# Patient Record
Sex: Male | Born: 2000 | Race: White | Hispanic: No | Marital: Single | Smoking: Never smoker
Health system: Southern US, Community
[De-identification: ages and names within clinical notes are randomized; demographics above are authoritative.]

## PROBLEM LIST (undated history)

## (undated) DIAGNOSIS — K0510 Chronic gingivitis, plaque induced: Secondary | ICD-10-CM

## (undated) DIAGNOSIS — J309 Allergic rhinitis, unspecified: Secondary | ICD-10-CM

## (undated) DIAGNOSIS — F909 Attention-deficit hyperactivity disorder, unspecified type: Secondary | ICD-10-CM

## (undated) DIAGNOSIS — R55 Syncope and collapse: Secondary | ICD-10-CM

## (undated) DIAGNOSIS — F32A Depression, unspecified: Secondary | ICD-10-CM

## (undated) DIAGNOSIS — Z8701 Personal history of pneumonia (recurrent): Secondary | ICD-10-CM

## (undated) DIAGNOSIS — Z8709 Personal history of other diseases of the respiratory system: Secondary | ICD-10-CM

## (undated) DIAGNOSIS — F329 Major depressive disorder, single episode, unspecified: Secondary | ICD-10-CM

## (undated) HISTORY — PX: OTHER SURGICAL HISTORY: SHX169

## (undated) HISTORY — DX: Syncope and collapse: R55

## (undated) HISTORY — DX: Allergic rhinitis, unspecified: J30.9

## (undated) HISTORY — DX: Chronic gingivitis, plaque induced: K05.10

## (undated) HISTORY — DX: Personal history of pneumonia (recurrent): Z87.01

## (undated) HISTORY — DX: Attention-deficit hyperactivity disorder, unspecified type: F90.9

## (undated) HISTORY — DX: Depression, unspecified: F32.A

## (undated) HISTORY — DX: Personal history of other diseases of the respiratory system: Z87.09

## (undated) HISTORY — PX: NASAL SEPTUM SURGERY: SHX37

---
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
## 2021-12-07 ENCOUNTER — Ambulatory Visit: Payer: 59 | Admitting: Behavioral Health

## 2021-12-11 ENCOUNTER — Ambulatory Visit (INDEPENDENT_AMBULATORY_CARE_PROVIDER_SITE_OTHER): Payer: 59 | Admitting: Psychiatry

## 2021-12-11 DIAGNOSIS — F4322 Adjustment disorder with anxiety: Secondary | ICD-10-CM | POA: Diagnosis not present

## 2021-12-11 NOTE — Progress Notes (Signed)
?    [facility] Counselor/Therapist Progress Note ? ?Patient ID: Thomas Graves, MRN: [id number],   ? ?Date: 12/11/2021 ? ?Time Spent: 50 minutes  ? ?Treatment Type: Individual Therapy ? ?Reported Symptoms: Anxiety, stressed, just completed his first year of college at the [employer] in [place]  ? ?Mental Status Exam: ? ?Appearance:   Casual     ?Behavior:  Appropriate, Sharing, and Motivated  ?Motor:  Normal  ?Speech/Language:   Clear and Coherent  ?Affect:  anxious  ?Mood:  anxious  ?Thought process:  goal directed  ?Thought content:    WNL  ?Sensory/Perceptual disturbances:    WNL  ?Orientation:  oriented to person, place, time/date, situation, day of week, month of year, year, and stated date of Dec 11, 2021  ?Attention:  Good  ?Concentration:  Good  ?Memory:  WNL  ?Fund of knowledge:   Good  ?Insight:    Good  ?Judgment:   Good  ?Impulse Control:  Good  ? ?Risk Assessment: ?Danger to Self:  No ?Self-injurious Behavior: No ?Danger to Others: No ?Duty to Warn:no ?Physical Aggression / Violence:No  ?Access to Firearms a concern: No  ?Gang Involvement:No  ? ?Subjective: Patient in today reporting anxiety after just completing first yr of college in [place].  Did not know anyone when he first went to school there within this past year and encountered some struggles early on but has been adjusting some over this first year.  Maturing some socially and working on some anxiety and adjustment issues, noting some progress as well as challenges he is still working on.  Did work some part-time near the campus but trying to better balance any work hours with his academic load.  His home this summer and involved in some things that his church as well as participating in an educational program in [place] for 1 month this summer as part of his schoolwork.  Patient nervous but also wants to have this opportunity as he feels that will help him personally and educationally.  Going to school out of state that far away  from home is a huge step for this young man and he is showing good motivation, insight, and resilience whenever challenges arise, and tries to stay "leveled out" when things do not always go as planned or present bigger obstacles than he expected.  Feeling some pride within himself after getting through this first year and looking forward to returning to college in mid August. ?  ?Interventions:  ?Solution-Oriented/Positive Psychology and Insight-Oriented ? ?Plan of Care:  ?Patient not signing tx plan on computer screen due to COVID. ?Treatment  Goals: ?Goals remain on plan as patient works on strategies to meet his goals.  Progress is noted every session in "Progress" section of Plan. ?Long term goal: ?Develop healthy interpersonal relationships that lead to the alleviation of anxiety and depression and help prevent relapse of anxious or depressive symtpoms.  ?Short term goal: ?Verbally express understanding of the relationship between anxious/depressed mood and repression of feelings, such as anger, hurt, and sadness. ?Strategies: ?1. Identify cognitive beliefs and self-talk that leads to feeling anxious or depressed. ?2. Assist in developing awareness of cognitive messages that reinforce anxiety or depression.   ? ?Diagnosis: ?  ICD-10-CM   ?1. Adjustment disorder with anxious mood  F43.22   ?  ? ?Plan: Patient in today after not being seen and overall due to being away for his first year at [employer].  Discusses some anxiety issues and adjustment issues which  we processed in session today.  Has matured in some ways showing some progress interpersonally and working and socializing with other people.  Shared several experiences of this during his campus life as well as working a part-time job at school.  Anxious thoughts are not as overpowering and works to interrupt them and replace with more reality-based and empowering thoughts that do not support anxiety.  Difficulty getting adjusted initially at  school but feels he is making some improvement, and it does show in his behavior although still a work in progress.  Self-confidence has improved and feels that he does still need to stay on his ADHD meds and will be seeing the med provider for that next week. Encouraged patient in more positive behaviors including: Staying in the present and focusing on what he can control or change, getting outside daily, having friend contacts daily, staying in touch with people who are supportive, looking for more positives versus negatives every day, healthy nutrition and exercise, practice consistent positive self talk, use of daily affirmations, challenge and counteract any self doubt, allow his faith to be an emotional support as well as spiritual, saying no when he needs to say no to set limits, and to recognize the strength he shows working with goal directed behaviors to move in a direction that supports his improved emotional health and self-confidence. ? ?Goal review and progress/challenges noted with patient. ? ?Next appointment within 2 weeks. ? ?This record has been created using [employer].  Chart creation errors have been sought, but may not always have been located and corrected.  Such creation errors do not reflect on the standard of medical care provided. ? ? ?Mathis Fare, LCSW ? ? ? ? ? ? ? ? ? ? ? ? ? ? ? ? ? ? ?

## 2021-12-16 ENCOUNTER — Ambulatory Visit (INDEPENDENT_AMBULATORY_CARE_PROVIDER_SITE_OTHER): Payer: 59 | Admitting: Behavioral Health

## 2021-12-16 ENCOUNTER — Encounter: Payer: Self-pay | Admitting: Behavioral Health

## 2021-12-16 VITALS — BP 117/66 | HR 55 | Ht 73.0 in | Wt 192.0 lb

## 2021-12-16 DIAGNOSIS — F4322 Adjustment disorder with anxiety: Secondary | ICD-10-CM | POA: Diagnosis not present

## 2021-12-16 DIAGNOSIS — F902 Attention-deficit hyperactivity disorder, combined type: Secondary | ICD-10-CM | POA: Diagnosis not present

## 2021-12-16 MED ORDER — METHYLPHENIDATE HCL ER (OSM) 18 MG PO TBCR: 18.0000 mg | EXTENDED_RELEASE_TABLET | Freq: Every day | ORAL | 0 refills | Status: DC

## 2021-12-16 NOTE — Progress Notes (Deleted)
[facility] MD/PA/NP Initial Note ? ?12/16/2021 10:05 AM ?Thomas Graves  ?MRN:  [id number] ? ?Chief Complaint:  ?Chief Complaint   ?ADHD; Medication Refill; Follow-up; Medication Problem ?  ? ? ?HPI: *** ? ?Visit Diagnosis:  ?  ICD-10-CM   ?1. Adjustment disorder with anxious mood  F43.22   ?  ?2. Attention deficit hyperactivity disorder (ADHD), combined type, mild  F90.2 methylphenidate 18 MG PO CR tablet  ?  ? ? ?Past Psychiatric History:  ? ?Past Medical History:  ?Past Medical History:  ?Diagnosis Date  ? ADHD (attention deficit hyperactivity disorder)   ? Allergic rhinitis   ? Depression   ? Gingivitis due to dental plaque   ? History of pneumonia   ? History of sinusitis   ? Vasovagal syncope   ?  ?Past Surgical History:  ?Procedure Laterality Date  ? arm surgery    ? NASAL SEPTUM SURGERY    ? ? ?Family Psychiatric History: *** ? ?Family History:  ?Family History  ?Problem Relation Age of Onset  ? Anxiety disorder Mother   ? Anxiety disorder Brother   ? Depression Brother   ? Anxiety disorder Maternal Grandfather   ? Anxiety disorder Other   ? ? ?Social History:  ?Social History  ? ?Socioeconomic History  ? Marital status: Single  ?  Spouse name: Not on file  ? Number of children: Not on file  ? Years of education: Not on file  ? Highest education level: 10th grade  ?Occupational History  ? Not on file  ?Tobacco Use  ? Smoking status: Never  ? Smokeless tobacco: Never  ?Vaping Use  ? Vaping Use: Never used  ?Substance and Sexual Activity  ? Alcohol use: No  ? Drug use: Never  ? Sexual activity: Never  ?Other Topics Concern  ? Not on file  ?Social History Narrative  ? 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  He is currently bored  and easily upset as grades are down including from procrastination and not focusing when he should be doing his best making 1300 on the PSAT as he hopes to have an out-of-town college in the future but underachieving in this critical school year.  He is apprehensive of having problems like older brother now in his third college at [employer] after having to leave [facility] by way of [employer] on medication from Corie Chiquito, PMHNP in this office for anxiety and depression.  Father tells the patient to just get it done while mother is attempting to be supportive and understanding as well as expectant and encouraging.  However the patient states he never has utilized reinforcement or reward to guide his behavior, being a deep thinker highly intelligent now finding his education overwhelming.  ? ?Social Determinants of Health  ? ?Financial Resource Strain: Not on file  ?Food Insecurity: Not on file  ?Transportation Needs: Not on file  ?Physical Activity: Not on file  ?Stress: Not on file  ?Social Connections: Not on file  ? ? ?Allergies: No Known Allergies ? ?Metabolic Disorder Labs: ?No results found for: HGBA1C, MPG ?No results found for: PROLACTIN ?No results found for: CHOL, TRIG, HDL, CHOLHDL, VLDL, LDLCALC ?No results found for: TSH ? ?Therapeutic Level Labs: ?  No results found for: LITHIUM ?No results found for: VALPROATE ?No components found for:  CBMZ ? ?Current Medications: ?Current Outpatient Medications  ?Medication Sig Dispense Refill  ? methylphenidate 18 MG PO CR tablet Take 1 tablet (18 mg total) by mouth daily. 30 tablet 0  ? acetaminophen-codeine (TYLENOL #3) 300-30 MG tablet Take 1 tablet by mouth every 6 (six) hours as needed for moderate pain. 10 tablet 0  ? methylphenidate 36 MG PO CR tablet Take 1 tablet (36 mg total) by mouth daily after breakfast. 30 tablet 0  ? methylphenidate 36 MG PO CR tablet Take 1 tablet (36 mg total) by mouth daily after breakfast. 30 tablet 0  ? methylphenidate 36  MG PO CR tablet Take 1 tablet (36 mg total) by mouth daily after breakfast. 30 tablet 0  ? ?No current facility-administered medications for this visit.  ? ? ?Medication Side Effects: {Medication Side Effects (Optional):12147} ? ?Orders placed this visit:  No orders of the defined types were placed in this encounter. ? ? ?Psychiatric Specialty Exam: ? ?Review of Systems  ?There were no vitals taken for this visit.There is no height or weight on file to calculate BMI.  ?General Appearance: {PSY:[id number]}  ?Eye Contact:  {PSY:22684}  ?Speech:  {PSY:[phone]}  ?Volume:  {PSY:22686}  ?Mood:  {PSY:22306}  ?Affect:  {[phone]}  ?Thought Process:  {PSY:22688}  ?Orientation:  {PSY:22689}  ?Thought Content: {PSY:22690}   ?Suicidal Thoughts:  {PSY:22692}  ?Homicidal Thoughts:  {PSY:22692}  ?Memory:  {PSY:[id number]}  ?Judgement:  {PSY:22694}  ?Insight:  {PSY:22695}  ?Psychomotor Activity:  {PSY:22696}  ?Concentration:  {PSY:21399}  ?Recall:  {PSY:22877}  ?Fund of Knowledge: {PSY:22877}  ?Language: {ESL:75300}  ?Assets:  {PSY:22698}  ?ADL's:  {PSY:22290}  ?Cognition: {FRT:[id number]}  ?Prognosis:  {PSY:22877}  ? ?Screenings:  ? ?Receiving Psychotherapy: {PSY:21197} ? ?Treatment Plan/Recommendations: *** ? ? ? ?Joan Flores, NP ? ?         ?

## 2021-12-16 NOTE — Progress Notes (Signed)
[facility] MD/PA/NP Initial Note ? ?12/16/2021 10:16 AM ?Thomas BentonNathan Graves  ?MRN:  [id number] ? ?Chief Complaint:  ?Chief Complaint   ?ADHD; Medication Refill; Follow-up; Medication Problem ?  ? ? ?HPI:  ?"Thomas Graves", 21 year old male presents to this office for initial visit and to establish care. He is former patient of Dr. Beverly MilchGlenn Jennings. He was diagnosed with ADHD about 2.5 years ago and took medication during his junior and senior years in HS. He is now a freshmen in college at [employer]. He says that he has been taking very sparingly and sometimes would not take the med but says the increased demands at school and the workload have substantiated a need to continue Methylphenidate. He says he has been doing well but would like to decrease the dosage this time. He denies anxiety and depression. Is sleeping 7-8 hours every night. No mania, no psychosis, no SI/HI. ? ?No prior medication failures ? ? ? ? ?Visit Diagnosis:  ?  ICD-10-CM   ?1. Adjustment disorder with anxious mood  F43.22   ?  ?2. Attention deficit hyperactivity disorder (ADHD), combined type, mild  F90.2 methylphenidate 18 MG PO CR tablet  ?  ? ? ?Past Psychiatric History:  ? ?Past Medical History:  ?Past Medical History:  ?Diagnosis Date  ? ADHD (attention deficit hyperactivity disorder)   ? Allergic rhinitis   ? Depression   ? Gingivitis due to dental plaque   ? History of pneumonia   ? History of sinusitis   ? Vasovagal syncope   ?  ?Past Surgical History:  ?Procedure Laterality Date  ? arm surgery    ? NASAL SEPTUM SURGERY    ? ? ?Family Psychiatric History: see chart ? ?Family History:  ?Family History  ?Problem Relation Age of Onset  ? Anxiety disorder Mother   ? Anxiety disorder Brother   ? Depression Brother   ? Anxiety disorder Maternal Grandfather   ? Anxiety disorder Other   ? ? ?Social History:  ?Social History  ? ?Socioeconomic History  ? Marital status: Single  ?  Spouse name: Not on file  ? Number of children: Not on file  ? Years  of education: Not on file  ? Highest education level: 10th grade  ?Occupational History  ? Not on file  ?Tobacco Use  ? Smoking status: Never  ? Smokeless tobacco: Never  ?Vaping Use  ? Vaping Use: Never used  ?Substance and Sexual Activity  ? Alcohol use: No  ? Drug use: Never  ? Sexual activity: Never  ?Other Topics Concern  ? Not on file  ?Social History Narrative  ? 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  He is currently bored and easily upset as grades are down including from procrastination and not focusing when he should be doing his best making 1300 on the PSAT as he hopes to have an out-of-town college in the future but underachieving in this critical school year.  He is apprehensive of having problems like older brother now in his third college at [employer] after having to leave [employer] by way of [employer] on medication from Corie ChiquitoJessica Carter, PMHNP in this office for anxiety and depression.  Father tells the patient to  just get it done while mother is attempting to be supportive and understanding as well as expectant and encouraging.  However the patient states he never has utilized reinforcement or reward to guide his behavior, being a deep thinker highly intelligent now finding his education overwhelming.  ? ?Social Determinants of Health  ? ?Financial Resource Strain: Not on file  ?Food Insecurity: Not on file  ?Transportation Needs: Not on file  ?Physical Activity: Not on file  ?Stress: Not on file  ?Social Connections: Not on file  ? ? ?Allergies: No Known Allergies ? ?Metabolic Disorder Labs: ?No results found for: HGBA1C, MPG ?No results found for: PROLACTIN ?No results found for: CHOL, TRIG, HDL, CHOLHDL,  VLDL, LDLCALC ?No results found for: TSH ? ?Therapeutic Level Labs: ?No results found for: LITHIUM ?No results found for: VALPROATE ?No components found for:  CBMZ ? ?Current Medications: ?Current Outpatient Medications  ?Medication Sig Dispense Refill  ? methylphenidate 18 MG PO CR tablet Take 1 tablet (18 mg total) by mouth daily. 30 tablet 0  ? acetaminophen-codeine (TYLENOL #3) 300-30 MG tablet Take 1 tablet by mouth every 6 (six) hours as needed for moderate pain. 10 tablet 0  ? methylphenidate 36 MG PO CR tablet Take 1 tablet (36 mg total) by mouth daily after breakfast. 30 tablet 0  ? methylphenidate 36 MG PO CR tablet Take 1 tablet (36 mg total) by mouth daily after breakfast. 30 tablet 0  ? methylphenidate 36 MG PO CR tablet Take 1 tablet (36 mg total) by mouth daily after breakfast. 30 tablet 0  ? ?No current facility-administered medications for this visit.  ? ? ?Medication Side Effects: none ? ?Orders placed this visit:  No orders of the defined types were placed in this encounter. ? ? ?Psychiatric Specialty Exam: ? ?Review of Systems  ?Constitutional: Negative.   ?Musculoskeletal:  Negative for gait problem.  ?Allergic/Immunologic: Negative.   ?Neurological:  Negative for tremors.  ?Psychiatric/Behavioral:  Positive for decreased concentration.    ?There were no vitals taken for this visit.There is no height or weight on file to calculate BMI.  ?General Appearance: Casual, Neat, and Well Groomed  ?Eye Contact:  Good  ?Speech:  Clear and Coherent  ?Volume:  Normal  ?Mood:  Angry  ?Affect:  Appropriate  ?Thought Process:  Coherent  ?Orientation:  Full (Time, Place, and Person)  ?Thought Content: Logical   ?Suicidal Thoughts:  No  ?Homicidal Thoughts:  No  ?Memory:  WNL  ?Judgement:  Good  ?Insight:  Good  ?Psychomotor Activity:  Normal  ?Concentration:  Concentration: Good  ?Recall:  Good  ?Fund of Knowledge: Good  ?Language: Good  ?Assets:  Desire for Improvement  ?ADL's:  Intact  ?Cognition: WNL   ?Prognosis:  Good  ? ?Screenings:  ?PHQ2-9   ? ?Flowsheet Row Office Visit from 12/16/2021 in [facility]  ?PHQ-2 Total Score 0  ? ?  ? ? ?Receiving Psychotherapy: No  ? ?Treatment Plan/Recommendations:  ? ?Greater than 50% of  45 min face to face time with patient was spent on counseling and coordination of care. We discussed his hx of ADHD and his care under Dr. Beverly Milch retired. He is using medication very sparingly and appropriately. He is requesting continued medication management and refills. ?We agreed to: ?Continue methylphenidate 18 mg daily after breakfast ?To follow up regularly for medication check ?Report side effects or problems  ?Provided emergency contact information ?Discussed potential benefits, risks, and side effects of stimulants with patient to include  increased heart rate, palpitations, insomnia, increased anxiety, increased irritability, or decreased appetite.  Instructed patient to contact office if experiencing any significant tolerability issues.  ?Reviewed PDMP ? ? ?Joan Flores, NP ? ?         ?

## 2021-12-30 ENCOUNTER — Ambulatory Visit: Payer: 59 | Admitting: Psychiatry

## 2022-01-06 ENCOUNTER — Telehealth: Payer: Self-pay | Admitting: Behavioral Health

## 2022-01-07 NOTE — Telephone Encounter (Signed)
Error

## 2022-05-28 ENCOUNTER — Telehealth: Payer: Self-pay | Admitting: Behavioral Health

## 2022-05-28 ENCOUNTER — Ambulatory Visit: Payer: 59 | Admitting: Behavioral Health

## 2022-05-28 ENCOUNTER — Other Ambulatory Visit: Payer: Self-pay

## 2022-05-28 DIAGNOSIS — F902 Attention-deficit hyperactivity disorder, combined type: Secondary | ICD-10-CM

## 2022-05-28 MED ORDER — METHYLPHENIDATE HCL ER (OSM) 18 MG PO TBCR: 18.0000 mg | EXTENDED_RELEASE_TABLET | Freq: Every day | ORAL | 0 refills | Status: DC

## 2022-05-28 NOTE — Telephone Encounter (Signed)
Pended.

## 2022-05-28 NOTE — Telephone Encounter (Signed)
Pt requesting Rx for Brand Concerta 18 mg to [facility] in [address].  Aaron Edelman agreed to sent 30 day Rx due to power outage and had to reschedule apt. Ask Barnett Applebaum to sign off on Rx ASAP. Pt going back to school today.

## 2022-05-28 NOTE — Telephone Encounter (Signed)
Pt's mom LVM @ 3:51p.  She is NOT on DPR.  She said that the Concerta sent to [facility] was denied and needs a PA.  Next appt 11/21

## 2022-05-31 NOTE — Telephone Encounter (Signed)
Pt picked up medication on 05/28/2022 for Brand name using commercial insurance

## 2022-06-22 ENCOUNTER — Ambulatory Visit (INDEPENDENT_AMBULATORY_CARE_PROVIDER_SITE_OTHER): Payer: 59 | Admitting: Behavioral Health

## 2022-06-22 ENCOUNTER — Telehealth: Payer: Self-pay | Admitting: Behavioral Health

## 2022-06-22 ENCOUNTER — Encounter: Payer: Self-pay | Admitting: Behavioral Health

## 2022-06-22 ENCOUNTER — Other Ambulatory Visit: Payer: Self-pay

## 2022-06-22 DIAGNOSIS — F4322 Adjustment disorder with anxiety: Secondary | ICD-10-CM

## 2022-06-22 DIAGNOSIS — F902 Attention-deficit hyperactivity disorder, combined type: Secondary | ICD-10-CM

## 2022-06-22 MED ORDER — METHYLPHENIDATE HCL ER (OSM) 18 MG PO TBCR: 18.0000 mg | EXTENDED_RELEASE_TABLET | Freq: Every day | ORAL | 0 refills | Status: DC

## 2022-06-22 NOTE — Telephone Encounter (Signed)
Patient's mom states that she informed by insurance that [facility] needs a PA for Concerta. Ph: [phone] Pls fax to [phone]

## 2022-06-22 NOTE — Progress Notes (Signed)
[facility] Med Check  Patient ID: Thomas Graves,  MRN: [id number]  PCP: Eartha Inch, MD  Date of Evaluation: 06/22/2022 Time spent:20 minutes  Chief Complaint:  Chief Complaint   ADHD; Follow-up; Patient Education; Medication Refill     HISTORY/CURRENT STATUS: HPI "Thomas Graves", 21 year old male presents to this office for follow up and medication management.  He would like to continue with his Concerta. Medication has been working well. Frustrated with the continue shortages. He denies anxiety and depression. Is sleeping 7-8 hours every night. No mania, no psychosis, no SI/HI.   No prior medication failures   Individual Medical History/ Review of Systems: Changes? :No   Allergies: Patient has no known allergies.  Current Medications:  Current Outpatient Medications:    acetaminophen-codeine (TYLENOL #3) 300-30 MG tablet, Take 1 tablet by mouth every 6 (six) hours as needed for moderate pain., Disp: 10 tablet, Rfl: 0   methylphenidate (CONCERTA) 18 MG PO CR tablet, Take 1 tablet (18 mg total) by mouth daily., Disp: 30 tablet, Rfl: 0   methylphenidate 18 MG PO CR tablet, Take 1 tablet (18 mg total) by mouth daily., Disp: 30 tablet, Rfl: 0   methylphenidate 36 MG PO CR tablet, Take 1 tablet (36 mg total) by mouth daily after breakfast., Disp: 30 tablet, Rfl: 0   methylphenidate 36 MG PO CR tablet, Take 1 tablet (36 mg total) by mouth daily after breakfast., Disp: 30 tablet, Rfl: 0   methylphenidate 36 MG PO CR tablet, Take 1 tablet (36 mg total) by mouth daily after breakfast., Disp: 30 tablet, Rfl: 0 Medication Side Effects: none  Family Medical/ Social History: Changes? No  MENTAL HEALTH EXAM:  There were no vitals taken for this visit.There is no height or weight on file to calculate BMI.  General Appearance: Casual  Eye Contact:  Good  Speech:  Clear and Coherent  Volume:  Normal  Mood:  NA  Affect:  Appropriate  Thought Process:  Coherent  Orientation:  Full  (Time, Place, and Person)  Thought Content: Logical   Suicidal Thoughts:  No  Homicidal Thoughts:  No  Memory:  WNL  Judgement:  Good  Insight:  Good  Psychomotor Activity:  Normal  Concentration:  Concentration: Good  Recall:  Good  Fund of Knowledge: Good  Language: Good  Assets:  Desire for Improvement  ADL's:  Intact  Cognition: WNL  Prognosis:  Good    DIAGNOSES:    ICD-10-CM   1. Attention deficit hyperactivity disorder (ADHD), combined type, mild  F90.2     2. Adjustment disorder with anxious mood  F43.22       Receiving Psychotherapy: No    RECOMMENDATIONS:   Greater than 50% of  20 min face to face time with patient was spent on counseling and coordination of care. He report doing fine with Concerta and his only issue has been obtaining the medication due to national shortage. 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 We agreed to: Continue methylphenidate 18 mg daily after breakfast To follow up regularly for medication check Report side effects or problems  Provided emergency contact information Discussed potential benefits, risks, and side effects of stimulants with patient to include increased heart rate, palpitations, insomnia, increased anxiety, increased irritability, or decreased appetite.  Instructed patient to contact office if experiencing any significant tolerability issues.  Reviewed PDMP     Joan Flores, NP

## 2022-06-22 NOTE — Telephone Encounter (Signed)
Patient was in the office today to see Arlys John. Mom came to my office and we discussed that insurance will not cover Brand Concerta. Patient is due for a refill 11/24 and is currently available at a local pharmacy. Refill pended to [facility]. Mom is aware that generic may need a PA also and that it can take a couple of days. She is also aware that we are closed Friday for TG.

## 2022-10-11 ENCOUNTER — Ambulatory Visit (INDEPENDENT_AMBULATORY_CARE_PROVIDER_SITE_OTHER): Payer: 59 | Admitting: Behavioral Health

## 2022-10-11 ENCOUNTER — Encounter: Payer: Self-pay | Admitting: Behavioral Health

## 2022-10-11 DIAGNOSIS — F902 Attention-deficit hyperactivity disorder, combined type: Secondary | ICD-10-CM | POA: Diagnosis not present

## 2022-10-11 MED ORDER — METHYLPHENIDATE HCL ER (OSM) 18 MG PO TBCR: 18.0000 mg | EXTENDED_RELEASE_TABLET | Freq: Every day | ORAL | 0 refills | Status: DC

## 2022-10-11 NOTE — Progress Notes (Signed)
[facility]  Patient ID: Thomas Graves,  MRN: [id number]  PCP: Chesley Noon, MD  Date of Evaluation: 10/11/2022 Time spent:20 minutes  Chief Complaint:  Chief Complaint   ADHD; Patient Education; Medication Refill; Follow-up     HISTORY/CURRENT STATUS: HPI "Thomas Graves", 22 year old male presents to this office for follow up and medication management.  He would like to continue with his Concerta. Medication has been working well. Frustrated with the continue shortages. He denies anxiety and depression. Is sleeping 7-8 hours every night. No mania, no psychosis, no SI/HI.   No prior medication failures Individual Medical History/ Review of Systems: Changes? :No   Allergies: Patient has no known allergies.  Current Medications:  Current Outpatient Medications:    acetaminophen-codeine (TYLENOL #3) 300-30 MG tablet, Take 1 tablet by mouth every 6 (six) hours as needed for moderate pain., Disp: 10 tablet, Rfl: 0   methylphenidate (CONCERTA) 18 MG PO CR tablet, Take 1 tablet (18 mg total) by mouth daily., Disp: 30 tablet, Rfl: 0   methylphenidate 18 MG PO CR tablet, Take 1 tablet (18 mg total) by mouth daily., Disp: 30 tablet, Rfl: 0   methylphenidate 36 MG PO CR tablet, Take 1 tablet (36 mg total) by mouth daily after breakfast., Disp: 30 tablet, Rfl: 0   methylphenidate 36 MG PO CR tablet, Take 1 tablet (36 mg total) by mouth daily after breakfast., Disp: 30 tablet, Rfl: 0   methylphenidate 36 MG PO CR tablet, Take 1 tablet (36 mg total) by mouth daily after breakfast., Disp: 30 tablet, Rfl: 0 Medication Side Effects: none  Family Medical/ Social History: Changes? No  MENTAL HEALTH EXAM:  There were no vitals taken for this visit.There is no height or weight on file to calculate BMI.  General Appearance: Casual and Neat  Eye Contact:  Good  Speech:  Clear and Coherent  Volume:  Normal  Mood:  NA  Affect:  Appropriate  Thought Process:  Coherent  Orientation:   Full (Time, Place, and Person)  Thought Content: Logical   Suicidal Thoughts:  No  Homicidal Thoughts:  No  Memory:  WNL  Judgement:  Good  Insight:  Good  Psychomotor Activity:  Normal  Concentration:  Concentration: Good  Recall:  Good  Fund of Knowledge: Good  Language: Good  Assets:  Desire for Improvement  ADL's:  Intact  Cognition: WNL  Prognosis:  Good    DIAGNOSES:    ICD-10-CM   1. Attention deficit hyperactivity disorder (ADHD), combined type, mild  F90.2 methylphenidate 18 MG PO CR tablet      Receiving Psychotherapy: No    RECOMMENDATIONS:   Greater than 50% of  20 min face to face time with patient was spent on counseling and coordination of care. He report doing fine with Concerta and his only issue has been obtaining the medication due to national shortage. 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 We agreed to: Continue methylphenidate 18 mg daily after breakfast To follow up regularly for medication check Report side effects or problems  Provided emergency contact information Discussed potential benefits, risks, and side effects of stimulants with patient to include increased heart rate, palpitations, insomnia, increased anxiety, increased irritability, or decreased appetite.  Instructed patient to contact office if experiencing any significant tolerability issues.  Reviewed PDMP      Elwanda Brooklyn, NP

## 2023-12-08 ENCOUNTER — Encounter: Payer: Self-pay | Admitting: Behavioral Health

## 2023-12-08 ENCOUNTER — Ambulatory Visit (INDEPENDENT_AMBULATORY_CARE_PROVIDER_SITE_OTHER): Admitting: Behavioral Health

## 2023-12-08 ENCOUNTER — Telehealth: Payer: Self-pay | Admitting: Behavioral Health

## 2023-12-08 DIAGNOSIS — F902 Attention-deficit hyperactivity disorder, combined type: Secondary | ICD-10-CM

## 2023-12-08 MED ORDER — METHYLPHENIDATE HCL ER (OSM) 27 MG PO TBCR: 27.0000 mg | EXTENDED_RELEASE_TABLET | ORAL | 0 refills | Status: DC

## 2023-12-08 NOTE — Progress Notes (Signed)
 [facility]  Patient ID: Thomas Graves,  MRN: [id number]  PCP: Emaline Handsome, MD  Date of Evaluation: 12/08/2023 Time spent:20 minutes  Chief Complaint:  Chief Complaint   ADHD; Follow-up; Patient Education; Medication Refill     HISTORY/CURRENT STATUS: HPI "Thomas Graves", 23 year old male presents to this office for follow up and medication management.  He would like to continue with his Concerta . Requesting increasing the dose to 27 mg.  He denies anxiety and depression. Is sleeping 7-8 hours every night. No mania, no psychosis, no SI/HI.   No prior medication failures Individual Medical History/ Review of Systems: Changes? :No   Allergies: Patient has no known allergies.  Current Medications:  Current Outpatient Medications:    methylphenidate  (CONCERTA ) 27 MG PO CR tablet, Take 1 tablet (27 mg total) by mouth every morning., Disp: 30 tablet, Rfl: 0   acetaminophen -codeine  (TYLENOL  #3) 300-30 MG tablet, Take 1 tablet by mouth every 6 (six) hours as needed for moderate pain., Disp: 10 tablet, Rfl: 0   methylphenidate  (CONCERTA ) 18 MG PO CR tablet, Take 1 tablet (18 mg total) by mouth daily., Disp: 30 tablet, Rfl: 0   methylphenidate  18 MG PO CR tablet, Take 1 tablet (18 mg total) by mouth daily., Disp: 30 tablet, Rfl: 0   methylphenidate  36 MG PO CR tablet, Take 1 tablet (36 mg total) by mouth daily after breakfast., Disp: 30 tablet, Rfl: 0   methylphenidate  36 MG PO CR tablet, Take 1 tablet (36 mg total) by mouth daily after breakfast., Disp: 30 tablet, Rfl: 0   methylphenidate  36 MG PO CR tablet, Take 1 tablet (36 mg total) by mouth daily after breakfast., Disp: 30 tablet, Rfl: 0 Medication Side Effects: none  Family Medical/ Social History: Changes? No  MENTAL HEALTH EXAM:  There were no vitals taken for this visit.There is no height or weight on file to calculate BMI.  General Appearance: Casual, Neat, and Well Groomed  Eye Contact:  Good  Speech:  Clear and  Coherent  Volume:  Normal  Mood:  NA  Affect:  Appropriate  Thought Process:  Coherent  Orientation:  Full (Time, Place, and Person)  Thought Content: Logical   Suicidal Thoughts:  No  Homicidal Thoughts:  No  Memory:  WNL  Judgement:  Good  Insight:  Good  Psychomotor Activity:  Normal  Concentration:  Concentration: Good  Recall:  Good  Fund of Knowledge: Good  Language: Good  Assets:  Desire for Improvement  ADL's:  Intact  Cognition: WNL  Prognosis:  Good    DIAGNOSES:    ICD-10-CM   1. Attention deficit hyperactivity disorder (ADHD), combined type, mild  F90.2 methylphenidate  (CONCERTA ) 27 MG PO CR tablet      Receiving Psychotherapy: No    RECOMMENDATIONS:   Greater than 50% of  20 min face to face time with patient was spent on counseling and coordination of care. Has been doing ok with Concerta  18 mg but would like to try increasing to 27 mg due to some afternoon decline.  We agreed to: Increase  methylphenidate  to 27 mg daily after breakfast To follow up regularly for medication check Report side effects or problems  Provided emergency contact information Discussed potential benefits, risks, and side effects of stimulants with patient to include increased heart rate, palpitations, insomnia, increased anxiety, increased irritability, or decreased appetite.  Instructed patient to contact office if experiencing any significant tolerability issues.  Reviewed PDMP     Lincoln Renshaw,  NP

## 2023-12-08 NOTE — Telephone Encounter (Signed)
 Mom called to report that the insurance no longer covers prescriptions at [facility].  Please cancel the prescription for Concerta  27mg  at [facility] and send to  [id number] - [place], [place] - [address]   Remover the [facility] from the preferred pharmacies since they can't use it.

## 2023-12-08 NOTE — Telephone Encounter (Signed)
 Canceled Rx at [facility], updated pharmacy profile.  Will repend to [facility].

## 2023-12-09 ENCOUNTER — Other Ambulatory Visit: Payer: Self-pay

## 2023-12-09 DIAGNOSIS — F902 Attention-deficit hyperactivity disorder, combined type: Secondary | ICD-10-CM

## 2023-12-09 MED ORDER — METHYLPHENIDATE HCL ER (OSM) 27 MG PO TBCR: 27.0000 mg | EXTENDED_RELEASE_TABLET | ORAL | 0 refills | Status: DC

## 2024-03-05 ENCOUNTER — Ambulatory Visit: Admitting: Behavioral Health

## 2024-03-06 ENCOUNTER — Ambulatory Visit (INDEPENDENT_AMBULATORY_CARE_PROVIDER_SITE_OTHER): Admitting: Behavioral Health

## 2024-03-06 ENCOUNTER — Encounter: Payer: Self-pay | Admitting: Behavioral Health

## 2024-03-06 DIAGNOSIS — F902 Attention-deficit hyperactivity disorder, combined type: Secondary | ICD-10-CM | POA: Diagnosis not present

## 2024-03-06 MED ORDER — METHYLPHENIDATE HCL ER (OSM) 27 MG PO TBCR: 27.0000 mg | EXTENDED_RELEASE_TABLET | ORAL | 0 refills | Status: AC

## 2024-03-06 MED ORDER — METHYLPHENIDATE HCL ER (OSM) 27 MG PO TBCR: 27.0000 mg | EXTENDED_RELEASE_TABLET | ORAL | 0 refills | Status: DC

## 2024-03-06 NOTE — Progress Notes (Signed)
 [facility]  Patient ID: Thomas Graves,  MRN: [id number]  PCP: Sophronia Ozell BROCKS, MD  Date of Evaluation: 03/06/2024 Time spent:20 minutes  Chief Complaint:  Chief Complaint   ADHD; Follow-up; Patient Education; Medication Refill     HISTORY/CURRENT STATUS: HPI   Thomas Graves, 23 year old male presents to this office for follow up and medication management.  He would like to continue with his Concerta . Currently very happy with dosage. He is returning to [place]  for college this fall.  He denies anxiety and depression. Is sleeping 7-8 hours every night. No mania, no psychosis, no SI/HI.   No prior medication failures  Individual Medical History/ Review of Systems: Changes? :No   Allergies: Patient has no known allergies.  Current Medications:  Current Outpatient Medications:    [START ON 04/05/2024] methylphenidate  (CONCERTA ) 27 MG PO CR tablet, Take 1 tablet (27 mg total) by mouth every morning., Disp: 30 tablet, Rfl: 0   [START ON 05/05/2024] methylphenidate  (CONCERTA ) 27 MG PO CR tablet, Take 1 tablet (27 mg total) by mouth every morning., Disp: 30 tablet, Rfl: 0   acetaminophen -codeine  (TYLENOL  #3) 300-30 MG tablet, Take 1 tablet by mouth every 6 (six) hours as needed for moderate pain., Disp: 10 tablet, Rfl: 0   methylphenidate  (CONCERTA ) 27 MG PO CR tablet, Take 1 tablet (27 mg total) by mouth every morning., Disp: 30 tablet, Rfl: 0 Medication Side Effects: none  Family Medical/ Social History: Changes? No  MENTAL HEALTH EXAM:  There were no vitals taken for this visit.There is no height or weight on file to calculate BMI.  General Appearance: Casual, Neat, and Well Groomed  Eye Contact:  Good  Speech:  Clear and Coherent  Volume:  Normal  Mood:  NA  Affect:  Appropriate  Thought Process:  Coherent  Orientation:  Full (Time, Place, and Person)  Thought Content: Logical   Suicidal Thoughts:  No  Homicidal Thoughts:  No  Memory:  WNL  Judgement:  Good   Insight:  Good  Psychomotor Activity:  Normal  Concentration:  Concentration: Good  Recall:  Good  Fund of Knowledge: Good  Language: Good  Assets:  Desire for Improvement  ADL's:  Intact  Cognition: WNL  Prognosis:  Good    DIAGNOSES:    ICD-10-CM   1. 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 Discussed his good stability with Concerta  27 mg. Requesting no med adjustments this visit.  We agreed to: Increase  methylphenidate  to 27 mg daily after breakfast To follow up regularly for medication check Report side effects or problems  Provided emergency contact information Discussed potential benefits, risks, and side effects of stimulants with patient to include increased heart rate, palpitations, insomnia, increased anxiety, increased irritability, or decreased appetite.  Instructed patient to contact office if experiencing any significant tolerability issues.  Reviewed PDMP    Redell DELENA Pizza, NP

## 2024-03-09 ENCOUNTER — Ambulatory Visit: Admitting: Behavioral Health

## 2024-07-17 ENCOUNTER — Encounter: Payer: Self-pay | Admitting: Behavioral Health

## 2024-07-17 ENCOUNTER — Ambulatory Visit: Admitting: Behavioral Health

## 2024-07-17 DIAGNOSIS — F902 Attention-deficit hyperactivity disorder, combined type: Secondary | ICD-10-CM

## 2024-07-17 MED ORDER — METHYLPHENIDATE HCL ER (OSM) 27 MG PO TBCR: 27.0000 mg | EXTENDED_RELEASE_TABLET | ORAL | 0 refills | Status: AC

## 2024-07-17 NOTE — Progress Notes (Signed)
 [facility]  Patient ID: Thomas Graves,  MRN: [id number]  PCP: Thomas Ozell BROCKS, MD  Date of Evaluation: 07/17/2024 Time spent:20 minutes  Chief Complaint:  Chief Complaint   Follow-up; Medication Refill; Patient Education; ADHD     HISTORY/CURRENT STATUS: HPI Thomas Graves, 23 year old male presents to this office for follow up and medication management.  He would like to continue with his Concerta .  Medication continues to be effective and assisting with attention and focus.  He is currently taking medication as needed.  Has questions about his refills.  He denies anxiety and depression. Is sleeping 7-8 hours every night. No mania, no psychosis, no SI/HI.   No prior medication failures  Individual Medical History/ Review of Systems: Changes? :No   Allergies: Patient has no known allergies.  Current Medications: Current Medications[1] Medication Side Effects: none  Family Medical/ Social History: Changes? No  MENTAL HEALTH EXAM:  There were no vitals taken for this visit.There is no height or weight on file to calculate BMI.  General Appearance: Casual, Neat, and Well Groomed  Eye Contact:  Good  Speech:  Clear and Coherent  Volume:  Normal  Mood:  NA  Affect:  Appropriate  Thought Process:  Coherent  Orientation:  Full (Time, Place, and Person)  Thought Content: Logical   Suicidal Thoughts:  No  Homicidal Thoughts:  No  Memory:  WNL  Judgement:  Good  Insight:  Good  Psychomotor Activity:  Normal  Concentration:  Concentration: Good  Recall:  Good  Fund of Knowledge: Good  Language: Good  Assets:  Desire for Improvement  ADL's:  Intact  Cognition: WNL  Prognosis:  Good    DIAGNOSES:    ICD-10-CM   1. 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  Concerta  27 mg continues to work very well for him.  Requesting no medication changes or adjustments this visit.   We agreed to: Continue methylphenidate  to 27 mg daily after breakfast To follow up regularly for medication check Report side effects or problems  Provided emergency contact information Discussed potential benefits, risks, and side effects of stimulants with patient to include increased heart rate, palpitations, insomnia, increased anxiety, increased irritability, or decreased appetite.  Instructed patient to contact office if experiencing any significant tolerability issues.  Reviewed PDMP      Thomas DELENA Pizza, NP     [1]  Current Outpatient Medications:    acetaminophen -codeine  (TYLENOL  #3) 300-30 MG tablet, Take 1 tablet by mouth every 6 (six) hours as needed for moderate pain., Disp: 10 tablet, Rfl: 0   methylphenidate  (CONCERTA ) 27 MG PO CR tablet, Take 1 tablet (27 mg total) by mouth every morning., Disp: 30 tablet, Rfl: 0   methylphenidate  (CONCERTA ) 27 MG PO CR tablet, Take 1 tablet (27 mg total) by mouth every morning., Disp: 30 tablet, Rfl: 0   methylphenidate  (CONCERTA ) 27 MG PO CR tablet, Take 1 tablet (27 mg total) by mouth every morning., Disp: 30 tablet, Rfl: 0

## 2024-12-10 ENCOUNTER — Ambulatory Visit: Admitting: Behavioral Health
